# Patient Record
Sex: Male | Born: 1995 | Race: Black or African American | Hispanic: No | Marital: Single | Smoking: Never smoker
Health system: Southern US, Community
[De-identification: ages and names within clinical notes are randomized; demographics above are authoritative.]

## PROBLEM LIST (undated history)

## (undated) ENCOUNTER — Ambulatory Visit: Payer: BLUE CROSS/BLUE SHIELD

---
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
## 2016-02-22 ENCOUNTER — Encounter (HOSPITAL_COMMUNITY): Payer: Self-pay | Admitting: Emergency Medicine

## 2016-02-22 ENCOUNTER — Emergency Department (HOSPITAL_COMMUNITY): Payer: No Typology Code available for payment source

## 2016-02-22 ENCOUNTER — Emergency Department (HOSPITAL_COMMUNITY)
Admission: EM | Admit: 2016-02-22 | Discharge: 2016-02-22 | Disposition: A | Discharge status: Home / Self Care | Payer: No Typology Code available for payment source | Attending: Emergency Medicine | Admitting: Emergency Medicine

## 2016-02-22 DIAGNOSIS — S060X9A Concussion with loss of consciousness of unspecified duration, initial encounter: Secondary | ICD-10-CM | POA: Diagnosis not present

## 2016-02-22 DIAGNOSIS — S0990XA Unspecified injury of head, initial encounter: Secondary | ICD-10-CM | POA: Diagnosis present

## 2016-02-22 DIAGNOSIS — M546 Pain in thoracic spine: Secondary | ICD-10-CM | POA: Diagnosis not present

## 2016-02-22 DIAGNOSIS — Y92410 Unspecified street and highway as the place of occurrence of the external cause: Secondary | ICD-10-CM | POA: Insufficient documentation

## 2016-02-22 DIAGNOSIS — Y990 Civilian activity done for income or pay: Secondary | ICD-10-CM | POA: Diagnosis not present

## 2016-02-22 DIAGNOSIS — Y939 Activity, unspecified: Secondary | ICD-10-CM | POA: Insufficient documentation

## 2016-02-22 DIAGNOSIS — Z79899 Other long term (current) drug therapy: Secondary | ICD-10-CM | POA: Insufficient documentation

## 2016-02-22 LAB — COMPREHENSIVE METABOLIC PANEL
ALBUMIN: 4.7 g/dL (ref 3.5–5.0)
ALT: 19 U/L (ref 17–63)
ANION GAP: 9 (ref 5–15)
AST: 25 U/L (ref 15–41)
Alkaline Phosphatase: 43 U/L (ref 38–126)
BILIRUBIN TOTAL: 0.6 mg/dL (ref 0.3–1.2)
BUN: 16 mg/dL (ref 6–20)
CO2: 25 mmol/L (ref 22–32)
Calcium: 9.6 mg/dL (ref 8.9–10.3)
Chloride: 104 mmol/L (ref 101–111)
Creatinine, Ser: 1.32 mg/dL — ABNORMAL HIGH (ref 0.61–1.24)
Glucose, Bld: 86 mg/dL (ref 65–99)
POTASSIUM: 3.8 mmol/L (ref 3.5–5.1)
Sodium: 138 mmol/L (ref 135–145)
TOTAL PROTEIN: 7.5 g/dL (ref 6.5–8.1)

## 2016-02-22 LAB — URINE MICROSCOPIC-ADD ON: RBC / HPF: NONE SEEN RBC/hpf (ref 0–5)

## 2016-02-22 LAB — CBC
HEMATOCRIT: 39.4 % (ref 39.0–52.0)
HEMOGLOBIN: 13.5 g/dL (ref 13.0–17.0)
MCH: 28.8 pg (ref 26.0–34.0)
MCHC: 34.3 g/dL (ref 30.0–36.0)
MCV: 84.2 fL (ref 78.0–100.0)
Platelets: 120 10*3/uL — ABNORMAL LOW (ref 150–400)
RBC: 4.68 MIL/uL (ref 4.22–5.81)
RDW: 12.6 % (ref 11.5–15.5)
WBC: 9.7 10*3/uL (ref 4.0–10.5)

## 2016-02-22 LAB — URINALYSIS, ROUTINE W REFLEX MICROSCOPIC
Glucose, UA: NEGATIVE mg/dL
Hgb urine dipstick: NEGATIVE
KETONES UR: 15 mg/dL — AB
Nitrite: NEGATIVE
PROTEIN: 30 mg/dL — AB
Specific Gravity, Urine: 1.031 — ABNORMAL HIGH (ref 1.005–1.030)
pH: 6.5 (ref 5.0–8.0)

## 2016-02-22 LAB — ETHANOL

## 2016-02-22 LAB — RAPID URINE DRUG SCREEN, HOSP PERFORMED
AMPHETAMINES: NOT DETECTED
BENZODIAZEPINES: NOT DETECTED
Barbiturates: NOT DETECTED
Cocaine: NOT DETECTED
OPIATES: NOT DETECTED
Tetrahydrocannabinol: POSITIVE — AB

## 2016-02-22 LAB — I-STAT CG4 LACTIC ACID, ED: LACTIC ACID, VENOUS: 1.14 mmol/L (ref 0.5–1.9)

## 2016-02-22 MED ORDER — IBUPROFEN 600 MG PO TABS: 600.0000 mg | ORAL_TABLET | Freq: Four times a day (QID) | ORAL | 0 refills | Status: AC | PRN

## 2016-02-22 MED ORDER — ACETAMINOPHEN 500 MG PO TABS
1000.0000 mg | ORAL_TABLET | Freq: Once | ORAL | Status: AC
  Administered 2016-02-22: 1000 mg via ORAL
  Filled 2016-02-22: qty 2
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
## 2016-02-22 NOTE — ED Notes (Signed)
Patient has returned from being out of the department; patient placed back on continuous pulse oximetry and blood pressure cuff; warm blanket given to patient; patient now resting with his eyes closed

## 2016-02-22 NOTE — ED Notes (Signed)
Pt comfortable with discharge and follow up instructions. Pt declines wheelchair, escorted to waiting area by this RN. Rx x1 

## 2016-02-22 NOTE — ED Notes (Signed)
Pt asked again about a urine sample, but stated that he "does not need to pee".

## 2016-02-22 NOTE — ED Notes (Signed)
Patient aware of need of urine specimen; patient handed urinal to use when he can; will check back later
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
## 2022-07-16 ENCOUNTER — Emergency Department (HOSPITAL_COMMUNITY): Payer: BLUE CROSS/BLUE SHIELD

## 2022-07-16 ENCOUNTER — Emergency Department (HOSPITAL_COMMUNITY)
Admission: EM | Admit: 2022-07-16 | Discharge: 2022-07-16 | Discharge status: Against Medical Advice | Payer: BLUE CROSS/BLUE SHIELD | Attending: Emergency Medicine | Admitting: Emergency Medicine

## 2022-07-16 ENCOUNTER — Encounter (HOSPITAL_COMMUNITY): Payer: Self-pay | Admitting: Emergency Medicine

## 2022-07-16 ENCOUNTER — Other Ambulatory Visit: Payer: Self-pay

## 2022-07-16 DIAGNOSIS — Z5321 Procedure and treatment not carried out due to patient leaving prior to being seen by health care provider: Secondary | ICD-10-CM | POA: Diagnosis not present

## 2022-07-16 DIAGNOSIS — R0602 Shortness of breath: Secondary | ICD-10-CM | POA: Insufficient documentation

## 2022-07-16 DIAGNOSIS — R079 Chest pain, unspecified: Secondary | ICD-10-CM | POA: Insufficient documentation

## 2022-07-16 LAB — CBC
HCT: 39.9 % (ref 39.0–52.0)
Hemoglobin: 13.5 g/dL (ref 13.0–17.0)
MCH: 29.3 pg (ref 26.0–34.0)
MCHC: 33.8 g/dL (ref 30.0–36.0)
MCV: 86.6 fL (ref 80.0–100.0)
Platelets: 162 10*3/uL (ref 150–400)
RBC: 4.61 MIL/uL (ref 4.22–5.81)
RDW: 12.6 % (ref 11.5–15.5)
WBC: 12.9 10*3/uL — ABNORMAL HIGH (ref 4.0–10.5)
nRBC: 0 % (ref 0.0–0.2)

## 2022-07-16 LAB — BASIC METABOLIC PANEL
Anion gap: 8 (ref 5–15)
BUN: 19 mg/dL (ref 6–20)
CO2: 24 mmol/L (ref 22–32)
Calcium: 8.8 mg/dL — ABNORMAL LOW (ref 8.9–10.3)
Chloride: 102 mmol/L (ref 98–111)
Creatinine, Ser: 1.31 mg/dL — ABNORMAL HIGH (ref 0.61–1.24)
GFR, Estimated: >60 mL/min (ref >60)
Glucose, Bld: 97 mg/dL (ref 70–99)
Potassium: 3.7 mmol/L (ref 3.5–5.1)
Sodium: 134 mmol/L — ABNORMAL LOW (ref 135–145)

## 2022-07-16 LAB — TROPONIN I (HIGH SENSITIVITY): Troponin I (High Sensitivity): 3 ng/L (ref <18)

## 2022-07-16 NOTE — ED Provider Triage Note (Signed)
Emergency Medicine Provider Triage Evaluation Note  Ernest Brewer , a 27 y.o. male  was evaluated in triage.  Pt complains of central and left sided chest pain that started this evening. Associated SOB. No identified alleviating or aggravating fractures.   Review of Systems  Positive: As above Negative: As above  Physical Exam  BP (!) 128/103 (BP Location: Left Arm)   Pulse 97   Temp 98 F (36.7 C) (Oral)   Resp 20   Ht 5\' 7"  (1.702 m)   Wt 66.7 kg   SpO2 98%   BMI 23.03 kg/m  Gen:   Awake, no distress   Resp:  Normal effort  MSK:   Moves extremities without difficulty  Other:  RRR no m/r/g. Lungs CTAB. Patient clinically appears intoxicated, smells strongly of marijuana.     Medical Decision Making  Medically screening exam initiated at 4:46 AM.  Appropriate orders placed.  Preet Perrier was informed that the remainder of the evaluation will be completed by another provider, this initial triage assessment does not replace that evaluation, and the importance of remaining in the ED until their evaluation is complete.  This chart was dictated using voice recognition software, Dragon. Despite the best efforts of this provider to proofread and correct errors, errors may still occur which can change documentation meaning.    Emeline Darling, PA-C 07/16/22 [phone]

## 2022-07-16 NOTE — ED Notes (Signed)
Pt not answering for vitals 2x  

## 2022-07-16 NOTE — ED Triage Notes (Signed)
Pt reports he is having left sided chest pain that started tonight.  Pt reports SOB when he lays down.

## 2022-07-16 NOTE — ED Notes (Signed)
Pt not responding for vitals 2x  

## 2024-02-20 ENCOUNTER — Ambulatory Visit (HOSPITAL_COMMUNITY): Admitting: Mental Health

## 2024-03-10 ENCOUNTER — Ambulatory Visit (HOSPITAL_COMMUNITY): Payer: Self-pay | Admitting: Mental Health

## 2024-06-04 ENCOUNTER — Ambulatory Visit (HOSPITAL_COMMUNITY)
Admission: EM | Admit: 2024-06-04 | Discharge: 2024-06-04 | Disposition: A | Discharge status: Home / Self Care | Attending: Psychiatry | Admitting: Psychiatry

## 2024-06-04 DIAGNOSIS — Z87828 Personal history of other (healed) physical injury and trauma: Secondary | ICD-10-CM | POA: Insufficient documentation

## 2024-06-04 DIAGNOSIS — Z653 Problems related to other legal circumstances: Secondary | ICD-10-CM | POA: Insufficient documentation

## 2024-06-04 DIAGNOSIS — F1290 Cannabis use, unspecified, uncomplicated: Secondary | ICD-10-CM | POA: Insufficient documentation

## 2024-06-04 DIAGNOSIS — G8929 Other chronic pain: Secondary | ICD-10-CM | POA: Insufficient documentation

## 2024-06-04 DIAGNOSIS — Z79899 Other long term (current) drug therapy: Secondary | ICD-10-CM | POA: Insufficient documentation

## 2024-06-04 DIAGNOSIS — F4010 Social phobia, unspecified: Secondary | ICD-10-CM | POA: Insufficient documentation

## 2024-06-04 DIAGNOSIS — F4310 Post-traumatic stress disorder, unspecified: Secondary | ICD-10-CM | POA: Insufficient documentation

## 2024-06-04 DIAGNOSIS — M549 Dorsalgia, unspecified: Secondary | ICD-10-CM | POA: Insufficient documentation

## 2024-06-04 MED ORDER — HYDROXYZINE HCL 25 MG PO TABS: 25.0000 mg | ORAL_TABLET | Freq: Three times a day (TID) | ORAL | 0 refills | Status: AC | PRN
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
## 2024-06-04 NOTE — Discharge Instructions (Addendum)
 Discharge recommendations:   Medications: Patient is to take medications as prescribed. The patient or patient's guardian is to contact a medical professional and/or outpatient provider to address any new side effects that develop. The patient or the patient's guardian should update outpatient providers of any new medications and/or medication changes.    Outpatient Follow up: Please review list of outpatient resources for psychiatry and counseling. Please follow up with your primary care provider for all medical related needs.    Therapy: We recommend that patient participate in individual therapy to address mental health concerns.   Safety:   The following safety precautions should be taken:   No sharp objects. This includes scissors, razors, scrapers, and putty knives.   Chemicals should be removed and locked up.   Medications should be removed and locked up.   Weapons should be removed and locked up. This includes firearms, knives and instruments that can be used to cause injury.   The patient should abstain from use of illicit substances/drugs and abuse of any medications.  If symptoms worsen or do not continue to improve or if the patient becomes actively suicidal or homicidal then it is recommended that the patient return to the closest hospital emergency department, the [facility], or call 911 for further evaluation and treatment. National Suicide Prevention Lifeline 1-800-SUICIDE or 415 609 9691.  About 988 988 offers 24/7 access to trained crisis counselors who can help people experiencing mental health-related distress. People can call or text 988 or chat 988lifeline.org for themselves or if they are worried about a loved one who may need crisis support.    You are encouraged to follow up with [facility] for outpatient treatment.  Walk in/ Open Access Hours: Monday - Friday 8AM - 10AM (To see provider and  therapist) PLEASE ARRIVE BY 7:00AM on the second floor   [facility] [address] [place], [place] [phone]
